# Patient Record
Sex: Female | Born: 1955 | Race: Black or African American | Hispanic: No | Marital: Married | Smoking: Former smoker
Health system: Southern US, Community
[De-identification: ages and names within clinical notes are randomized; demographics above are authoritative.]

## PROBLEM LIST (undated history)

## (undated) DIAGNOSIS — I10 Essential (primary) hypertension: Secondary | ICD-10-CM

## (undated) DIAGNOSIS — E119 Type 2 diabetes mellitus without complications: Secondary | ICD-10-CM

## (undated) DIAGNOSIS — B351 Tinea unguium: Secondary | ICD-10-CM

## (undated) DIAGNOSIS — E785 Hyperlipidemia, unspecified: Secondary | ICD-10-CM

## (undated) DIAGNOSIS — K649 Unspecified hemorrhoids: Secondary | ICD-10-CM

## (undated) HISTORY — PX: ABDOMINAL HYSTERECTOMY: SHX81

## (undated) HISTORY — DX: Unspecified hemorrhoids: K64.9

## (undated) HISTORY — DX: Tinea unguium: B35.1

## (undated) HISTORY — DX: Hyperlipidemia, unspecified: E78.5

---

## 2018-08-13 ENCOUNTER — Encounter: Payer: Self-pay | Admitting: Emergency Medicine

## 2018-08-13 ENCOUNTER — Other Ambulatory Visit: Payer: Self-pay

## 2018-08-13 ENCOUNTER — Ambulatory Visit
Admission: EM | Admit: 2018-08-13 | Discharge: 2018-08-13 | Disposition: A | Discharge status: Home / Self Care | Payer: Federal, State, Local not specified - PPO | Attending: Family Medicine | Admitting: Family Medicine

## 2018-08-13 ENCOUNTER — Ambulatory Visit (INDEPENDENT_AMBULATORY_CARE_PROVIDER_SITE_OTHER): Payer: Federal, State, Local not specified - PPO

## 2018-08-13 DIAGNOSIS — M545 Low back pain, unspecified: Secondary | ICD-10-CM

## 2018-08-13 DIAGNOSIS — K5900 Constipation, unspecified: Secondary | ICD-10-CM | POA: Diagnosis not present

## 2018-08-13 HISTORY — DX: Type 2 diabetes mellitus without complications: E11.9

## 2018-08-13 HISTORY — DX: Essential (primary) hypertension: I10

## 2018-08-13 NOTE — Discharge Instructions (Signed)
Increase water intake Miralax Tylenol as needed Heat to low back area

## 2018-08-13 NOTE — ED Provider Notes (Signed)
[facility]    CSN: [id number] Arrival date & time: 08/13/18  0827     History   Chief Complaint Chief Complaint  Patient presents with  . Constipation  . Back Pain    HPI Anne Foster is a 62 y.o. female.   62 yo female with a c/o low back pain and constipation for one week. Denies any specific injury, however states she does go to the gym to exercise several times per week. Yesterday took some Mag Citrate for her constipation which helped slightly. Denies any fevers, chills, melena, hematochezia, dysuria, hematuria.   The history is provided by the patient.  Constipation  Associated symptoms: back pain   Back Pain    Past Medical History:  Diagnosis Date  . Diabetes mellitus without complication (HCC)   . Hypertension     There are no active problems to display for this patient.   Past Surgical History:  Procedure Laterality Date  . ABDOMINAL HYSTERECTOMY      OB History   None      Home Medications    Prior to Admission medications   Medication Sig Start Date End Date Taking? Authorizing Provider  metFORMIN (GLUCOPHAGE-XR) 500 MG 24 hr tablet Take by mouth. 03/20/16  Yes [provider]  simvastatin (ZOCOR) 20 MG tablet Take by mouth. 03/24/16  Yes [provider]  fluticasone Aleda Grana) 50 MCG/ACT nasal spray  08/09/18   [provider]  losartan-hydrochlorothiazide Mauri Reading) 100-25 MG tablet  06/07/18   [provider]  Multiple Vitamin (MULTI-VITAMINS) TABS Take by mouth.    [provider]    Family History History reviewed. No pertinent family history.  Social History Social History   Tobacco Use  . Smoking status: Never Smoker  . Smokeless tobacco: Never Used  Substance Use Topics  . Alcohol use: Not Currently  . Drug use: Never     Allergies   Morphine; Nsaids; and Oxycodone-acetaminophen   Review of Systems Review of Systems  Gastrointestinal: Positive for constipation.    Musculoskeletal: Positive for back pain.     Physical Exam Triage Vital Signs ED Triage Vitals  Enc Vitals Group     BP 08/13/18 0847 120/78     Pulse Rate 08/13/18 0847 74     Resp 08/13/18 0847 18     Temp 08/13/18 0847 98.7 F (37.1 C)     Temp Source 08/13/18 0847 Oral     SpO2 08/13/18 0847 97 %     Weight 08/13/18 0842 (!) 302 lb (137 kg)     Height 08/13/18 0842 5\' 7"  (1.702 m)     Head Circumference --      Peak Flow --      Pain Score 08/13/18 0842 7     Pain Loc --      Pain Edu? --      Excl. in GC? --    No data found.  Updated Vital Signs BP 120/78 (BP Location: Left Arm)   Pulse 74   Temp 98.7 F (37.1 C) (Oral)   Resp 18   Ht 5\' 7"  (1.702 m)   Wt (!) 137 kg   SpO2 97%   BMI 47.30 kg/m   Visual Acuity Right Eye Distance:   Left Eye Distance:   Bilateral Distance:    Right Eye Near:   Left Eye Near:    Bilateral Near:     Physical Exam  Constitutional: She appears well-developed and well-nourished. No distress.  Abdominal:  Soft. Bowel sounds are normal. She exhibits no distension and no mass. There is no tenderness. There is no rebound and no guarding. No hernia.  Musculoskeletal:       Lumbar back: She exhibits tenderness (right sided lumbar sacral paraspinous muscles) and spasm. She exhibits normal range of motion, no bony tenderness, no swelling, no edema, no deformity, no laceration, no pain and normal pulse.  Skin: She is not diaphoretic.  Nursing note and vitals reviewed.    UC Treatments / Results  Labs (all labs ordered are listed, but only abnormal results are displayed) Labs Reviewed - No data to display  EKG None  Radiology Dg Abd 2 Views  Result Date: 08/13/2018 CLINICAL DATA:  Abdominal and back pain. EXAM: ABDOMEN - 2 VIEW COMPARISON:  None. FINDINGS: The visualized lung bases are grossly clear. The abdominal bowel gas pattern is unremarkable. Scattered stool noted throughout the colon and down into the rectum. Scattered  loops of small bowel with air but no distension or air-fluid levels to suggest obstruction. No free air is identified. The soft tissue shadows are maintained. No worrisome calcifications. The bony structures are unremarkable. IMPRESSION: Unremarkable abdominal radiographs. Electronically Signed   By: Rudie MeyerP.  Gallerani M.D.   On: 08/13/2018 09:52    Procedures Procedures (including critical care time)  Medications Ordered in UC Medications - No data to display  Initial Impression / Assessment and Plan / UC Course  I have reviewed the triage vital signs and the nursing notes.  Pertinent labs & imaging results that were available during my care of the patient were reviewed by me and considered in my medical decision making (see chart for details).      Final Clinical Impressions(s) / UC Diagnoses   Final diagnoses:  Constipation, unspecified constipation type  Acute right-sided low back pain without sciatica     Discharge Instructions     Increase water intake Miralax Tylenol as needed Heat to low back area    ED Prescriptions    None     1. x-ray results and diagnosis reviewed with patient 2. Recommend supportive treatment as above 3. Follow-up prn if symptoms worsen or don't improve    Controlled Substance Prescriptions [place] Controlled Substance Registry consulted? Not Applicable   Payton Mccallumonty, Tenessa Marsee, MD 08/13/18 1350

## 2018-08-13 NOTE — ED Triage Notes (Signed)
Patient c/o low back pain that started 1 week ago. She stated that she is contributing this to the constipation that she has had x 1 week. Yesterday she took Mag Citrate and her back pain has increased and she has not had a normal BM in 1-2 weeks. She reports last BM yesterday.

## 2019-10-31 IMAGING — CR DG ABDOMEN 2V
5 series · 5 of 5 positions shown · non-contrast
Comparison: None.

CLINICAL DATA: Abdominal and back pain.

EXAM:
ABDOMEN - 2 VIEW

[abdomen erect (1 of 3)]
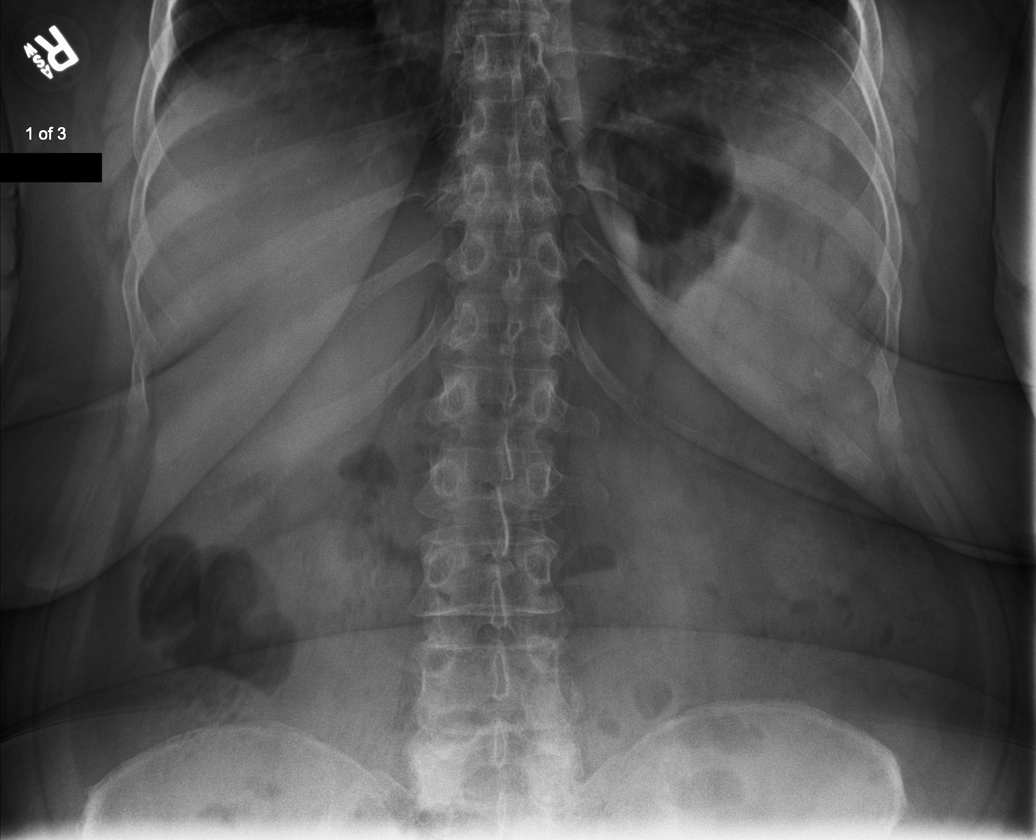

[abdomen supine (1 of 2)]
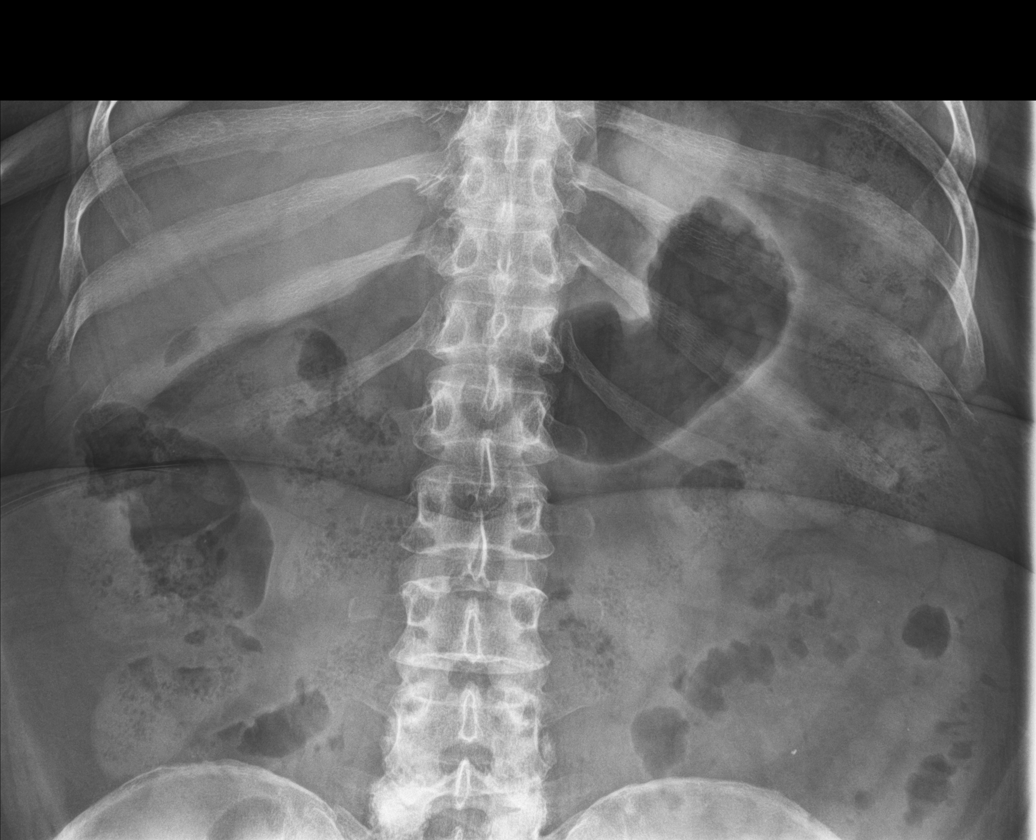

[abdomen supine (2 of 2)]
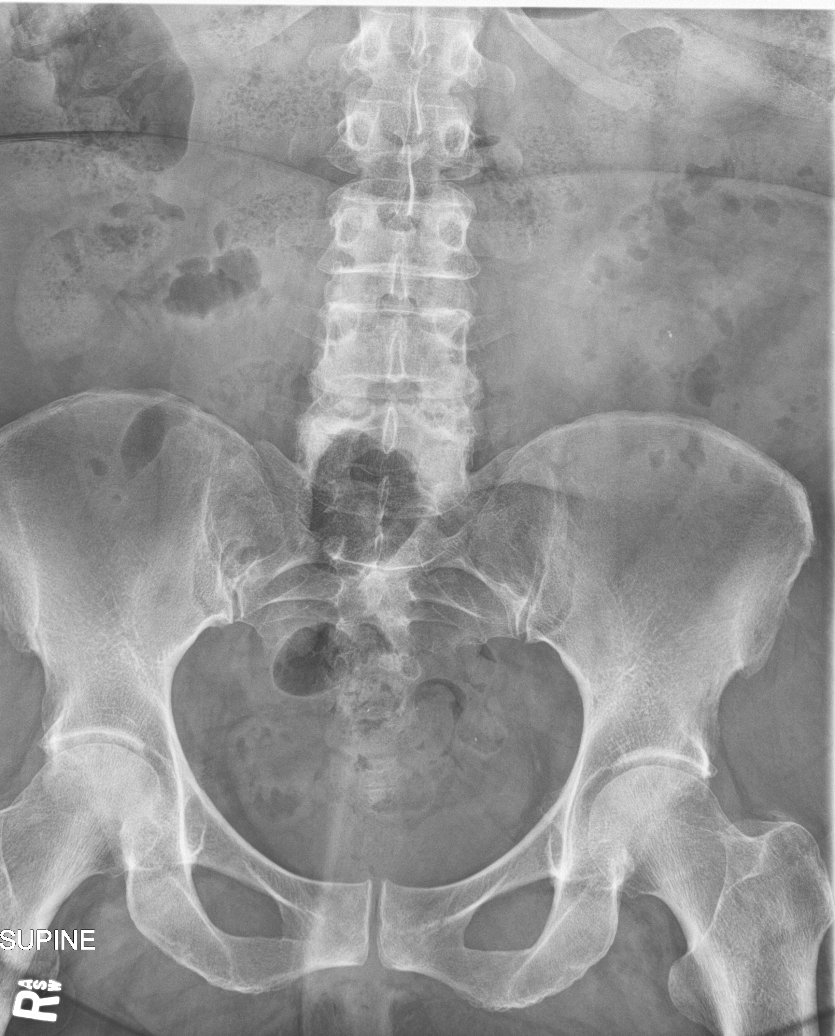

[abdomen erect (2 of 3)]
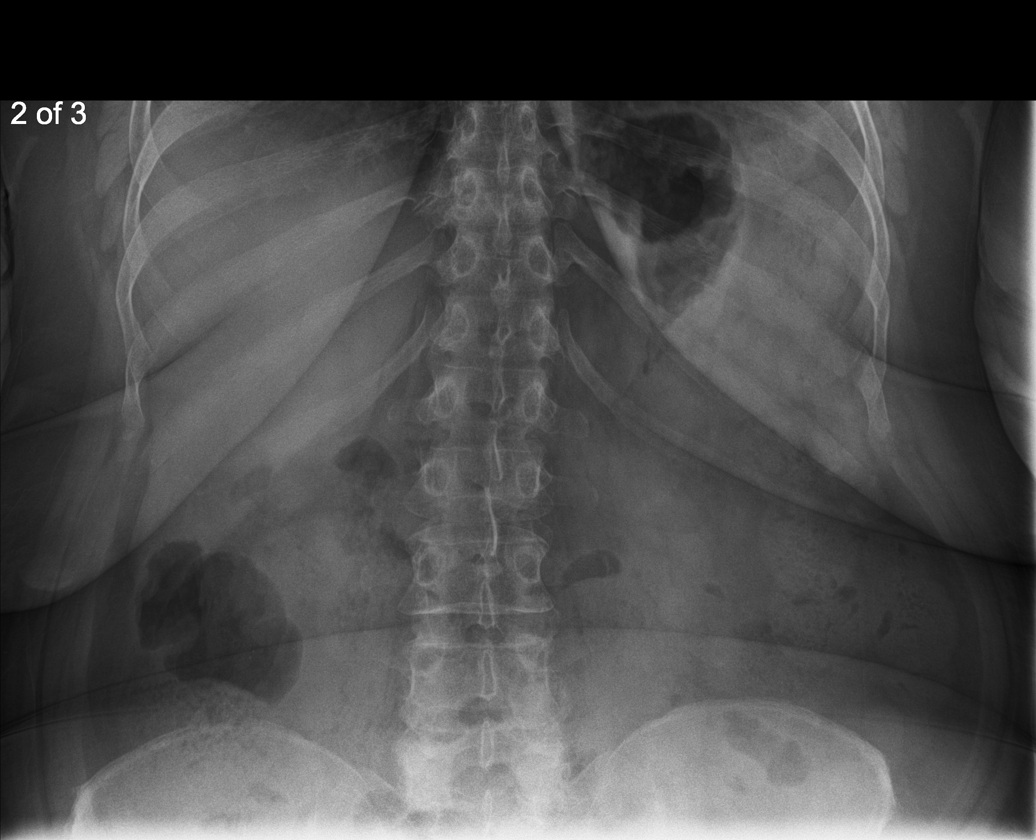

[abdomen erect (3 of 3)]
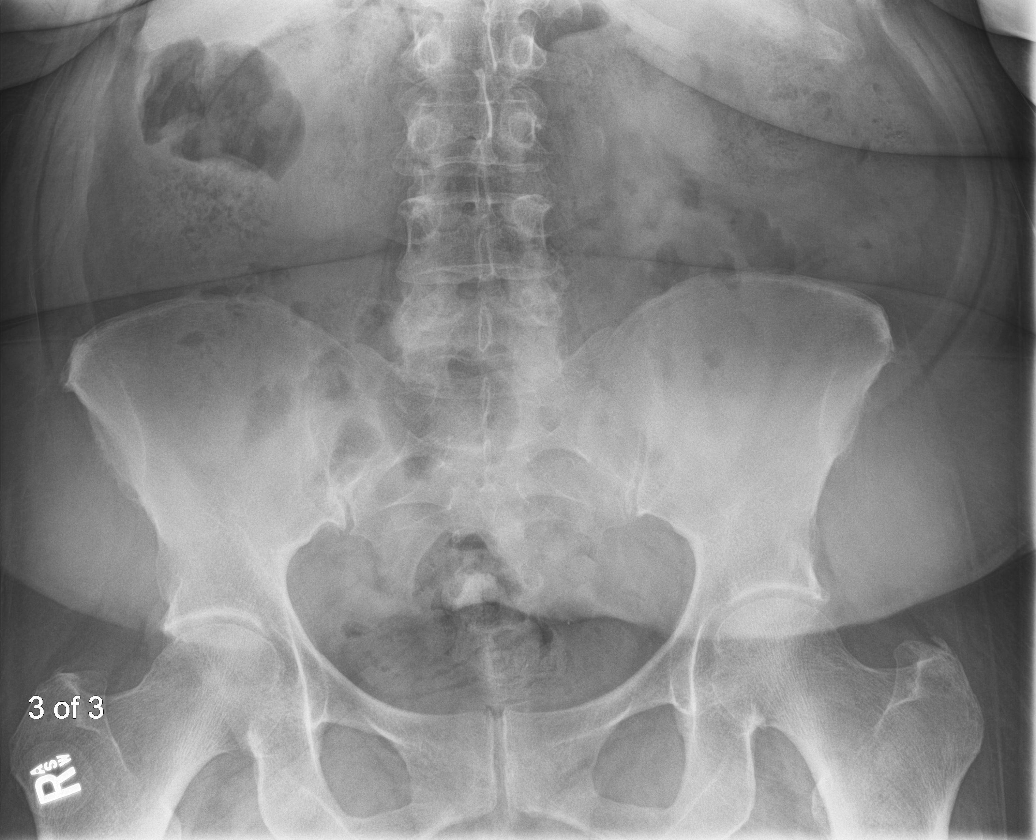

[5 of 5 positions shown; findings below may reference images not displayed]

FINDINGS: The visualized lung bases are grossly clear.

The abdominal bowel gas pattern is unremarkable. Scattered stool
noted throughout the colon and down into the rectum. Scattered loops
of small bowel with air but no distension or air-fluid levels to
suggest obstruction. No free air is identified. The soft tissue
shadows are maintained. No worrisome calcifications. The bony
structures are unremarkable.
IMPRESSION: Unremarkable abdominal radiographs.

## 2021-07-01 ENCOUNTER — Ambulatory Visit (INDEPENDENT_AMBULATORY_CARE_PROVIDER_SITE_OTHER): Payer: Medicare Other | Admitting: Internal Medicine

## 2021-07-01 VITALS — BP 136/91 | Resp 18 | Ht 67.0 in | Wt 270.1 lb

## 2021-07-01 DIAGNOSIS — G4733 Obstructive sleep apnea (adult) (pediatric): Secondary | ICD-10-CM | POA: Diagnosis not present

## 2021-07-01 DIAGNOSIS — Z7189 Other specified counseling: Secondary | ICD-10-CM | POA: Diagnosis not present

## 2021-07-01 DIAGNOSIS — I10 Essential (primary) hypertension: Secondary | ICD-10-CM | POA: Diagnosis not present

## 2021-07-01 DIAGNOSIS — Z9989 Dependence on other enabling machines and devices: Secondary | ICD-10-CM

## 2021-07-01 NOTE — Progress Notes (Signed)
[facility] [address] [place], [place] [address]  Pulmonary Sleep Medicine   Office Visit Note  Patient Name: Anne Foster DOB: June 25, 1956 MRN [id number]    Chief Complaint: Obstructive Sleep Apnea visit  Brief History:  Anne Foster is seen today for initial consult to establish care and currently on CPAP @ 12 cmH2O The patient has a 9y ear  history of sleep apnea. Patient is using PAP nightly and had issues with sinus problems.  The patient feels good enough to exercise in the mornings after sleeping with PAP.  The patient reports benefitting from PAP use.  Epworth Sleepiness Score is 3 out of 24. The patient 1 -2 times take naps. The patient complains of the following: dry mouth but declines increasing humidity, but prefers to drinks water as needed over night.  The compliance download shows  compliance with an average use time of 6:24 hours @ 87% hours. The AHI is 0.4  The patient does not complain  of limb movements disrupting sleep.  ROS  General: (-) fever, (-) chills, (-) night sweat Nose and Sinuses: (-) nasal stuffiness or itchiness, (-) postnasal drip, (-) nosebleeds, (-) sinus trouble. Mouth and Throat: (-) sore throat, (-) hoarseness. Neck: (-) swollen glands, (-) enlarged thyroid, (-) neck pain. Respiratory: - cough, - shortness of breath, - wheezing. Neurologic: - numbness, - tingling. Psychiatric: - anxiety, - depression   Current Medication: Outpatient Encounter Medications as of 07/01/2021  Medication Sig   fluticasone (FLONASE) 50 MCG/ACT nasal spray instill 2 sprays into each nostril once daily   atorvastatin (LIPITOR) 20 MG tablet Take 20 mg by mouth daily.   cyanocobalamin 1000 MCG tablet Take by mouth.   fluconazole (DIFLUCAN) 200 MG tablet Take by mouth.   fluticasone (FLONASE) 50 MCG/ACT nasal spray    hydrocortisone (PROCTOZONE-HC) 2.5 % rectal cream Proctozone-HC 2.5 % topical cream perineal applicator   losartan (COZAAR) 50 MG tablet Take  50 mg by mouth daily.   losartan-hydrochlorothiazide (HYZAAR) 100-25 MG tablet    metFORMIN (GLUCOPHAGE-XR) 500 MG 24 hr tablet Take by mouth.   Multiple Vitamin (MULTI-VITAMINS) TABS Take by mouth.   RYBELSUS 14 MG TABS Take 1 tablet by mouth every morning.   [DISCONTINUED] simvastatin (ZOCOR) 20 MG tablet Take by mouth.   No facility-administered encounter medications on file as of 07/01/2021.    Surgical History: Past Surgical History:  Procedure Laterality Date   ABDOMINAL HYSTERECTOMY      Medical History: Past Medical History:  Diagnosis Date   Diabetes mellitus without complication (HCC)    Hemorrhoid    Hyperlipidemia    Hypertension    Onychomycosis     Family History: Non contributory to the present illness  Social History: Social History   Socioeconomic History   Marital status: Married    Spouse name: Not on file   Number of children: Not on file   Years of education: Not on file   Highest education level: Not on file  Occupational History   Not on file  Tobacco Use   Smoking status: Former    Types: Cigarettes   Smokeless tobacco: Never  Substance and Sexual Activity   Alcohol use: Not Currently   Drug use: Never   Sexual activity: Not on file  Other Topics Concern   Not on file  Social History Narrative   Not on file   Social Determinants of Health   Financial Resource Strain: Not on file  Food Insecurity: Not on file  Transportation  Needs: Not on file  Physical Activity: Not on file  Stress: Not on file  Social Connections: Not on file  Intimate Partner Violence: Not on file    Vital Signs: Blood pressure (!) 136/91, resp. rate 18, height 5\' 7"  (1.702 m), weight 270 lb 1.6 oz (122.5 kg), SpO2 97 %.  Examination: General Appearance: The patient is well-developed, well-nourished, and in no distress. Neck Circumference: 36.5 Skin: Gross inspection of skin unremarkable. Head: normocephalic, no gross deformities. Eyes: no gross  deformities noted. ENT: ears appear grossly normal Neurologic: Alert and oriented. No involuntary movements.    EPWORTH SLEEPINESS SCALE:  Scale:  (0)= no chance of dozing; (1)= slight chance of dozing; (2)= moderate chance of dozing; (3)= high chance of dozing  Chance  Situtation    Sitting and reading: 0    Watching TV: 1    Sitting Inactive in public: 0    As a passenger in car: 1      Lying down to rest: 1    Sitting and talking: 0    Sitting quielty after lunch: 0    In a car, stopped in traffic: 0   TOTAL SCORE:   3 out of 24    SLEEP STUDIES:  PSG 03/22/12 -  AHI 32 REM 40.5, low SpO59min 83%,  recommended CPAP @ 12 cmH2O   CPAP COMPLIANCE DATA:  Date Range: 06/29/20-06/28/21  Average Daily Use: 6:24 hours  Median Use: 7:11cmH2O  Compliance for > 4 Hours: 87% days  AHI: 0.4 respiratory events per hour  Days Used: 324/365 days  Mask Leak: 26.3 lpm  95th Percentile Pressure: 12 cmH2O  LABS: No results found for this or any previous visit (from the past 2160 hour(s)).  Radiology: DG Abd 2 Views  Result Date: 08/13/2018 CLINICAL DATA:  Abdominal and back pain. EXAM: ABDOMEN - 2 VIEW COMPARISON:  None. FINDINGS: The visualized lung bases are grossly clear. The abdominal bowel gas pattern is unremarkable. Scattered stool noted throughout the colon and down into the rectum. Scattered loops of small bowel with air but no distension or air-fluid levels to suggest obstruction. No free air is identified. The soft tissue shadows are maintained. No worrisome calcifications. The bony structures are unremarkable. IMPRESSION: Unremarkable abdominal radiographs. Electronically Signed   By: 08/15/2018 M.D.   On: 08/13/2018 09:52    No results found.  No results found.    Assessment and Plan: Patient Active Problem List   Diagnosis Date Noted   OSA on CPAP 07/01/2021   CPAP use counseling 07/01/2021   Morbid obesity (HCC) 07/01/2021   Hypertension  07/01/2021   1. OSA on CPAP The patient does tolerate PAP and reports definite benefit from PAP use. The patient was reminded how to clean equipment and advised to replace supplies routinely. She has some leak and is reminded of the need to change out her seals etc. The patient was also counselled on weight loss. The compliance is very good. The AHI is 0.4.   OSA- continue with very good compliance. F/u one year.    2. CPAP use counseling CPAP Counseling: had a lengthy discussion with the patient regarding the importance of PAP therapy in management of the sleep apnea. Patient appears to understand the risk factor reduction and also understands the risks associated with untreated sleep apnea. Patient will try to make a good faith effort to remain compliant with therapy. Also instructed the patient on proper cleaning of the device including the water must be  changed daily if possible and use of distilled water is preferred. Patient understands that the machine should be regularly cleaned with appropriate recommended cleaning solutions that do not damage the PAP machine for example given white vinegar and water rinses. Other methods such as ozone treatment may not be as good as these simple methods to achieve cleaning.   3. Morbid obesity (HCC) Obesity Counseling: Had a lengthy discussion regarding patients BMI and weight issues. Patient was instructed on portion control as well as increased activity. Also discussed caloric restrictions with trying to maintain intake less than 2000 Kcal. Discussions were made in accordance with the 5As of weight management. Simple actions such as not eating late and if able to, taking a walk is suggested.   4. Hypertension, unspecified type Hypertension Counseling:   The following hypertensive lifestyle modification were recommended and discussed:  1. Limiting alcohol intake to less than 1 oz/day of ethanol:(24 oz of beer or 8 oz of wine or 2 oz of 100-proof  whiskey). 2. Take baby ASA 81 mg daily. 3. Importance of regular aerobic exercise and losing weight. 4. Reduce dietary saturated fat and cholesterol intake for overall cardiovascular health. 5. Maintaining adequate dietary potassium, calcium, and magnesium intake. 6. Regular monitoring of the blood pressure. 7. Reduce sodium intake to less than 100 mmol/day (less than 2.3 gm of sodium or less than 6 gm of sodium choride)      General Counseling: I have discussed the findings of the evaluation and examination with Scarlette Calico.  I have also discussed any further diagnostic evaluation thatmay be needed or ordered today. Emelynn verbalizes understanding of the findings of todays visit. We also reviewed her medications today and discussed drug interactions and side effects including but not limited excessive drowsiness and altered mental states. We also discussed that there is always a risk not just to her but also people around her. she has been encouraged to call the office with any questions or concerns that should arise related to todays visit.  No orders of the defined types were placed in this encounter.       I have personally obtained a history, examined the patient, evaluated laboratory and imaging results, formulated the assessment and plan and placed orders.   This patient was seen today by Emmaline Kluver, PA-C in collaboration with Dr. Freda Munro.    Yevonne Pax, MD [facility] Diplomate ABMS Pulmonary and Critical Care Medicine Sleep medicine

## 2021-07-01 NOTE — Patient Instructions (Signed)

## 2022-06-30 ENCOUNTER — Ambulatory Visit (INDEPENDENT_AMBULATORY_CARE_PROVIDER_SITE_OTHER): Payer: Medicare Other | Admitting: Internal Medicine

## 2022-06-30 VITALS — BP 119/83 | HR 83 | Resp 12 | Ht 66.0 in | Wt 275.6 lb

## 2022-06-30 DIAGNOSIS — Z7189 Other specified counseling: Secondary | ICD-10-CM | POA: Diagnosis not present

## 2022-06-30 DIAGNOSIS — I10 Essential (primary) hypertension: Secondary | ICD-10-CM

## 2022-06-30 DIAGNOSIS — G4733 Obstructive sleep apnea (adult) (pediatric): Secondary | ICD-10-CM

## 2022-06-30 DIAGNOSIS — Z9989 Dependence on other enabling machines and devices: Secondary | ICD-10-CM

## 2022-06-30 NOTE — Patient Instructions (Signed)

## 2022-06-30 NOTE — Progress Notes (Unsigned)
[facility] [address] [place], [place] [address]  Pulmonary Sleep Medicine   Office Visit Note  Patient Name: Anne Foster DOB: [birth year] MRN [id number]    Chief Complaint: Obstructive Sleep Apnea visit  Brief History:  [name] is seen today for an annual follow up visit for CPAP@ 12 cmH2O. The patient has a 10 year history of sleep apnea. Patient is using PAP nightly.  The patient feels rested after sleeping with PAP.  The patient reports benefiting from PAP use. Reported sleepiness is  improved and the Epworth Sleepiness Score is 4 out of 24. The patient continues to require PAP therapy in order to eliminate her sleep apnea. The patient does take naps 3 days a week for approximately an hour. The patient complains of the following: oral dryness. She keeps water by the bed. Admits to some mouth venting and uses a chin strap. Advised the Xylamelts and Biotene for help with this.  The compliance download shows 81% compliance with an average use time of 6 hours 29 minutes. The AHI is 0.4.  The patient does not complain of limb movements disrupting sleep.  ROS  General: (-) fever, (-) chills, (-) night sweat Nose and Sinuses: (-) nasal stuffiness or itchiness, (-) postnasal drip, (-) nosebleeds, (-) sinus trouble. Mouth and Throat: (-) sore throat, (-) hoarseness. Neck: (-) swollen glands, (-) enlarged thyroid, (-) neck pain. Respiratory: - cough, - shortness of breath, - wheezing. Neurologic: - numbness, - tingling. Psychiatric: - anxiety, - depression   Current Medication: Outpatient Encounter Medications as of 06/30/2022  Medication Sig   atorvastatin (LIPITOR) 20 MG tablet Take 20 mg by mouth daily.   cyanocobalamin 1000 MCG tablet Take by mouth.   fluconazole (DIFLUCAN) 200 MG tablet Take by mouth.   fluticasone (FLONASE) 50 MCG/ACT nasal spray    fluticasone (FLONASE) 50 MCG/ACT nasal spray instill 2 sprays into each nostril once daily   hydrocortisone  (PROCTOZONE-HC) 2.5 % rectal cream Proctozone-HC 2.5 % topical cream perineal applicator   losartan (COZAAR) 50 MG tablet Take 50 mg by mouth daily.   losartan-hydrochlorothiazide (HYZAAR) 100-25 MG tablet    metFORMIN (GLUCOPHAGE-XR) 500 MG 24 hr tablet Take by mouth.   Multiple Vitamin (MULTI-VITAMINS) TABS Take by mouth.   RYBELSUS 14 MG TABS Take 1 tablet by mouth every morning.   No facility-administered encounter medications on file as of 06/30/2022.    Surgical History: Past Surgical History:  Procedure Laterality Date   ABDOMINAL HYSTERECTOMY      Medical History: Past Medical History:  Diagnosis Date   Diabetes mellitus without complication (HCC)    Hemorrhoid    Hyperlipidemia    Hypertension    Onychomycosis     Family History: Non contributory to the present illness  Social History: Social History   Socioeconomic History   Marital status: Married    Spouse name: Not on file   Number of children: Not on file   Years of education: Not on file   Highest education level: Not on file  Occupational History   Not on file  Tobacco Use   Smoking status: Former    Types: Cigarettes   Smokeless tobacco: Never  Substance and Sexual Activity   Alcohol use: Not Currently   Drug use: Never   Sexual activity: Not on file  Other Topics Concern   Not on file  Social History Narrative   Not on file   Social Determinants of Health   Financial Resource Strain: Not on  file  Food Insecurity: Not on file  Transportation Needs: Not on file  Physical Activity: Not on file  Stress: Not on file  Social Connections: Not on file  Intimate Partner Violence: Not on file    Vital Signs: There were no vitals taken for this visit. There is no height or weight on file to calculate BMI.    Examination: General Appearance: The patient is well-developed, well-nourished, and in no distress. Neck Circumference: 36.5 cm Skin: Gross inspection of skin unremarkable. Head:  normocephalic, no gross deformities. Eyes: no gross deformities noted. ENT: ears appear grossly normal Neurologic: Alert and oriented. No involuntary movements.    EPWORTH SLEEPINESS SCALE:  Scale:  (0)= no chance of dozing; (1)= slight chance of dozing; (2)= moderate chance of dozing; (3)= high chance of dozing  Chance  Situtation    Sitting and reading: 1    Watching TV: 1    Sitting Inactive in public: 0    As a passenger in car: 1      Lying down to rest: 1    Sitting and talking: 0    Sitting quielty after lunch: 0    In a car, stopped in traffic: 0   TOTAL SCORE:   4 out of 24    SLEEP STUDIES:  PSG (03/2012) AHI 32/hr, REM AHI 41/hr, min SpO2 83% Titration (03/2020) CPAP@ 12 cmH2O   CPAP COMPLIANCE DATA:  Date Range: 06/27/2021-06/26/2022  Average Daily Use: 6 hours 29 minutes  Median Use: 6 hours 23 minutes  Compliance for > 4 Hours: 81%  AHI: 0.4 respiratory events per hour  Days Used: 312/365 days  Mask Leak: 28.8  95th Percentile Pressure: 12         LABS: No results found for this or any previous visit (from the past 2160 hour(s)).  Radiology: DG Abd 2 Views  Result Date: 08/13/2018 CLINICAL DATA:  Abdominal and back pain. EXAM: ABDOMEN - 2 VIEW COMPARISON:  None. FINDINGS: The visualized lung bases are grossly clear. The abdominal bowel gas pattern is unremarkable. Scattered stool noted throughout the colon and down into the rectum. Scattered loops of small bowel with air but no distension or air-fluid levels to suggest obstruction. No free air is identified. The soft tissue shadows are maintained. No worrisome calcifications. The bony structures are unremarkable. IMPRESSION: Unremarkable abdominal radiographs. Electronically Signed   By: Rudie Meyer M.D.   On: 08/13/2018 09:52    No results found.  No results found.    Assessment and Plan: Patient Active Problem List   Diagnosis Date Noted   OSA on CPAP 07/01/2021    CPAP use counseling 07/01/2021   Morbid obesity (HCC) 07/01/2021   Hypertension 07/01/2021    1. OSA on CPAP The patient does  tolerate PAP and reports  benefit from PAP use. The patient was reminded how to clean equipment and advised to replace supplies routinely. The patient was also counselled on weight loss. The compliance is good. The AHI is 0.4.   OSA on cpap- CPAP continues to be medically necessary to treat this patient's OSA.  Continue with good compliance. F/u one year.    2. CPAP use counseling CPAP Counseling: had a lengthy discussion with the patient regarding the importance of PAP therapy in management of the sleep apnea. Patient appears to understand the risk factor reduction and also understands the risks associated with untreated sleep apnea. Patient will try to make a good faith effort to remain compliant with therapy. Also  instructed the patient on proper cleaning of the device including the water must be changed daily if possible and use of distilled water is preferred. Patient understands that the machine should be regularly cleaned with appropriate recommended cleaning solutions that do not damage the PAP machine for example given white vinegar and water rinses. Other methods such as ozone treatment may not be as good as these simple methods to achieve cleaning.   3. Morbid obesity (HCC) Obesity Counseling: Had a lengthy discussion regarding patients BMI and weight issues. Patient was instructed on portion control as well as increased activity. Also discussed caloric restrictions with trying to maintain intake less than 2000 Kcal. Discussions were made in accordance with the 5As of weight management. Simple actions such as not eating late and if able to, taking a walk is suggested.   4. Hypertension, unspecified type Hypertension Counseling:   The following hypertensive lifestyle modification were recommended and discussed:  1. Limiting alcohol intake to less than 1 oz/day  of ethanol:(24 oz of beer or 8 oz of wine or 2 oz of 100-proof whiskey). 2. Take baby ASA 81 mg daily. 3. Importance of regular aerobic exercise and losing weight. 4. Reduce dietary saturated fat and cholesterol intake for overall cardiovascular health. 5. Maintaining adequate dietary potassium, calcium, and magnesium intake. 6. Regular monitoring of the blood pressure. 7. Reduce sodium intake to less than 100 mmol/day (less than 2.3 gm of sodium or less than 6 gm of sodium choride)     General Counseling: I have discussed the findings of the evaluation and examination with Scarlette Calico.  I have also discussed any further diagnostic evaluation thatmay be needed or ordered today. Florabelle verbalizes understanding of the findings of todays visit. We also reviewed her medications today and discussed drug interactions and side effects including but not limited excessive drowsiness and altered mental states. We also discussed that there is always a risk not just to her but also people around her. she has been encouraged to call the office with any questions or concerns that should arise related to todays visit.  No orders of the defined types were placed in this encounter.       I have personally obtained a history, examined the patient, evaluated laboratory and imaging results, formulated the assessment and plan and placed orders. This patient was seen today by Emmaline Kluver, PA-C in collaboration with Dr. Freda Munro.   Yevonne Pax, MD [facility] Diplomate ABMS Pulmonary Critical Care Medicine and Sleep Medicine

## 2023-06-29 ENCOUNTER — Ambulatory Visit (INDEPENDENT_AMBULATORY_CARE_PROVIDER_SITE_OTHER): Payer: Medicare Other | Admitting: Internal Medicine

## 2023-06-29 VITALS — BP 122/92 | HR 70 | Resp 14 | Ht 67.0 in | Wt 276.0 lb

## 2023-06-29 DIAGNOSIS — G4733 Obstructive sleep apnea (adult) (pediatric): Secondary | ICD-10-CM

## 2023-06-29 DIAGNOSIS — Z7189 Other specified counseling: Secondary | ICD-10-CM

## 2023-06-29 NOTE — Progress Notes (Unsigned)
[facility] [address] [place], [place] [address]  Pulmonary Sleep Medicine   Office Visit Note  Patient Name: Anne Foster DOB: 1956-05-05 MRN [id number]    Chief Complaint: Obstructive Sleep Apnea visit  Brief History:  Anne Foster is seen today for an annual follow up on CPAP at 12 cmh20.  The patient has a 11 year history of sleep apnea. Patient is using PAP nightly.  The patient feels rested after sleeping with PAP.  The patient reports benefiting from PAP use. Reported sleepiness is  improved and the Epworth Sleepiness Score is 4 out of 24. The patient occasionally take naps. The patient complains of the following: No complaints.  The compliance download shows 92% compliance with an average use time of 6:06 hours. The AHI is 0.3.  The patient does not complain of limb movements disrupting sleep.  ROS  General: (-) fever, (-) chills, (-) night sweat Nose and Sinuses: (-) nasal stuffiness or itchiness, (-) postnasal drip, (-) nosebleeds, (-) sinus trouble. Mouth and Throat: (-) sore throat, (-) hoarseness. Neck: (-) swollen glands, (-) enlarged thyroid, (-) neck pain. Respiratory: - cough, - shortness of breath, - wheezing. Neurologic: - numbness, - tingling. Psychiatric: - anxiety, - depression   Current Medication: Outpatient Encounter Medications as of 06/29/2023  Medication Sig   tirzepatide (MOUNJARO) 10 MG/0.5ML Pen    atorvastatin (LIPITOR) 20 MG tablet Take 20 mg by mouth daily.   cetirizine (ZYRTEC) 10 MG tablet Take by mouth.   cyanocobalamin 1000 MCG tablet Take by mouth.   fluticasone (FLONASE) 50 MCG/ACT nasal spray    fluticasone (FLONASE) 50 MCG/ACT nasal spray instill 2 sprays into each nostril once daily   hydrochlorothiazide (HYDRODIURIL) 12.5 MG tablet    hydrocortisone (PROCTOZONE-HC) 2.5 % rectal cream Proctozone-HC 2.5 % topical cream perineal applicator   losartan (COZAAR) 100 MG tablet    Multiple Vitamin (MULTI-VITAMINS) TABS Take by  mouth.   [DISCONTINUED] fluconazole (DIFLUCAN) 200 MG tablet Take by mouth.   [DISCONTINUED] losartan (COZAAR) 50 MG tablet Take 50 mg by mouth daily.   [DISCONTINUED] losartan-hydrochlorothiazide (HYZAAR) 100-25 MG tablet    [DISCONTINUED] metFORMIN (GLUCOPHAGE-XR) 500 MG 24 hr tablet Take by mouth.   [DISCONTINUED] RYBELSUS 14 MG TABS Take 1 tablet by mouth every morning.   No facility-administered encounter medications on file as of 06/29/2023.    Surgical History: Past Surgical History:  Procedure Laterality Date   ABDOMINAL HYSTERECTOMY      Medical History: Past Medical History:  Diagnosis Date   Diabetes mellitus without complication (HCC)    Hemorrhoid    Hyperlipidemia    Hypertension    Onychomycosis     Family History: Non contributory to the present illness  Social History: Social History   Socioeconomic History   Marital status: Married    Spouse name: Not on file   Number of children: Not on file   Years of education: Not on file   Highest education level: Not on file  Occupational History   Not on file  Tobacco Use   Smoking status: Former    Types: Cigarettes   Smokeless tobacco: Never  Substance and Sexual Activity   Alcohol use: Not Currently   Drug use: Never   Sexual activity: Not on file  Other Topics Concern   Not on file  Social History Narrative   Not on file   Social Determinants of Health   Financial Resource Strain: Not on file  Food Insecurity: Not on file  Transportation  Needs: Not on file  Physical Activity: Not on file  Stress: Not on file  Social Connections: Not on file  Intimate Partner Violence: Not on file    Vital Signs: Blood pressure (!) 122/92, pulse 70, resp. rate 14, height 5\' 7"  (1.702 m), weight 276 lb (125.2 kg), SpO2 98%. Body mass index is 43.23 kg/m.    Examination: General Appearance: The patient is well-developed, well-nourished, and in no distress. Neck Circumference: 37 cm Skin: Gross  inspection of skin unremarkable. Head: normocephalic, no gross deformities. Eyes: no gross deformities noted. ENT: ears appear grossly normal Neurologic: Alert and oriented. No involuntary movements.  STOP BANG RISK ASSESSMENT S (snore) Have you been told that you snore?     NO   T (tired) Are you often tired, fatigued, or sleepy during the day?   NO  O (obstruction) Do you stop breathing, choke, or gasp during sleep? NO   P (pressure) Do you have or are you being treated for high blood pressure? YES   B (BMI) Is your body index greater than 35 kg/m? YES   A (age) Are you 69 years old or older? YES   N (neck) Do you have a neck circumference greater than 16 inches?   NO   G (gender) Are you a female? NO   TOTAL STOP/BANG "YES" ANSWERS 3       A STOP-Bang score of 2 or less is considered low risk, and a score of 5 or more is high risk for having either moderate or severe OSA. For people who score 3 or 4, doctors may need to perform further assessment to determine how likely they are to have OSA.         EPWORTH SLEEPINESS SCALE:  Scale:  (0)= no chance of dozing; (1)= slight chance of dozing; (2)= moderate chance of dozing; (3)= high chance of dozing  Chance  Situtation    Sitting and reading: 1    Watching TV: 1    Sitting Inactive in public: 0    As a passenger in car: 2      Lying down to rest: 0    Sitting and talking: 0    Sitting quielty after lunch: 0    In a car, stopped in traffic: 0   TOTAL SCORE:   4 out of 24    SLEEP STUDIES:  PSG (04/13) AHI 32, REM AHI 41, min SP02 83%   CPAP COMPLIANCE DATA:  Date Range: 06/25/22 - 06/24/23  Average Daily Use: 6:06 hours  Median Use: 6:19 hours  Compliance for > 4 Hours: 335 days  AHI: 0.3 respiratory events per hour  Days Used: 346/365  Mask Leak: 24.5  95th Percentile Pressure: 12 cmh20         LABS: No results found for this or any previous visit (from the past 2160  hour(s)).  Radiology: DG Abd 2 Views  Result Date: 08/13/2018 CLINICAL DATA:  Abdominal and back pain. EXAM: ABDOMEN - 2 VIEW COMPARISON:  None. FINDINGS: The visualized lung bases are grossly clear. The abdominal bowel gas pattern is unremarkable. Scattered stool noted throughout the colon and down into the rectum. Scattered loops of small bowel with air but no distension or air-fluid levels to suggest obstruction. No free air is identified. The soft tissue shadows are maintained. No worrisome calcifications. The bony structures are unremarkable. IMPRESSION: Unremarkable abdominal radiographs. Electronically Signed   By: Rudie Meyer M.D.   On: 08/13/2018 09:52  No results found.  No results found.    Assessment and Plan: Patient Active Problem List   Diagnosis Date Noted   OSA on CPAP 07/01/2021   CPAP use counseling 07/01/2021   Morbid obesity (HCC) 07/01/2021   Hypertension 07/01/2021   1. OSA on CPAP The patient does tolerate PAP and reports  benefit from PAP use. The patient was reminded how to clean equipment and advised to replace supplies routinely. The patient was also counselled on weight loss. The compliance is very good. The AHI is 0.3.   OSA on cpap- controlled. Continue with excellent compliance with pap. CPAP continues to be medically necessary to treat this patient's OSA. F/u one year.    2. CPAP use counseling CPAP Counseling: had a lengthy discussion with the patient regarding the importance of PAP therapy in management of the sleep apnea. Patient appears to understand the risk factor reduction and also understands the risks associated with untreated sleep apnea. Patient will try to make a good faith effort to remain compliant with therapy. Also instructed the patient on proper cleaning of the device including the water must be changed daily if possible and use of distilled water is preferred. Patient understands that the machine should be regularly cleaned with  appropriate recommended cleaning solutions that do not damage the PAP machine for example given white vinegar and water rinses. Other methods such as ozone treatment may not be as good as these simple methods to achieve cleaning.   3. Morbid obesity (HCC) Obesity Counseling: Had a lengthy discussion regarding patients BMI and weight issues. Patient was instructed on portion control as well as increased activity. Also discussed caloric restrictions with trying to maintain intake less than 2000 Kcal. Discussions were made in accordance with the 5As of weight management. Simple actions such as not eating late and if able to, taking a walk is suggested.       General Counseling: I have discussed the findings of the evaluation and examination with Scarlette Calico.  I have also discussed any further diagnostic evaluation thatmay be needed or ordered today. [place] verbalizes understanding of the findings of todays visit. We also reviewed her medications today and discussed drug interactions and side effects including but not limited excessive drowsiness and altered mental states. We also discussed that there is always a risk not just to her but also people around her. she has been encouraged to call the office with any questions or concerns that should arise related to todays visit.  No orders of the defined types were placed in this encounter.       I have personally obtained a history, examined the patient, evaluated laboratory and imaging results, formulated the assessment and plan and placed orders. This patient was seen today by Emmaline Kluver, PA-C in collaboration with Dr. Freda Munro.   Yevonne Pax, MD [facility] Diplomate ABMS Pulmonary Critical Care Medicine and Sleep Medicine

## 2023-06-29 NOTE — Patient Instructions (Signed)

## 2024-06-24 NOTE — Progress Notes (Signed)
 [facility] [address] [place], [place] [address]  Pulmonary Sleep Medicine   Office Visit Note  Patient Name: Anne Foster DOB: [birth year] MRN [id number]    Chief Complaint: Obstructive Sleep Apnea visit  Brief History:  Anne Foster is seen today for an annual follow up visit for CPAP@ 12 cmH2O. The patient has a 12 year history of sleep apnea. Patient is using PAP nightly.  The patient feels rested after sleeping with PAP.  The patient reports benefiting from PAP use. Reported sleepiness is  improved and the Epworth Sleepiness Score is 3 out of 24. The patient does not take naps. The patient complains of the following: pt is in need of a new water chamber.  The compliance download shows 99% compliance with an average use time of 7 hours 16 minutes. The AHI is 0.4.  The patient occasionally complains  of limb movements disrupting sleep. The patient continues to require PAP therapy in order to eliminate sleep apnea.   ROS  General: (-) fever, (-) chills, (-) night sweat Nose and Sinuses: (-) nasal stuffiness or itchiness, (-) postnasal drip, (-) nosebleeds, (-) sinus trouble. Mouth and Throat: (-) sore throat, (-) hoarseness. Neck: (-) swollen glands, (-) enlarged thyroid, (-) neck pain. Respiratory: - cough, - shortness of breath, - wheezing. Neurologic: - numbness, - tingling. Psychiatric: - anxiety, - depression   Current Medication: Outpatient Encounter Medications as of 06/27/2024  Medication Sig   MOUNJARO 12.5 MG/0.5ML Pen Inject 12.5 mg into the skin once a week.   atorvastatin (LIPITOR) 20 MG tablet Take 20 mg by mouth daily.   cetirizine (ZYRTEC) 10 MG tablet Take by mouth.   cyanocobalamin 1000 MCG tablet Take by mouth.   fluticasone (FLONASE) 50 MCG/ACT nasal spray instill 2 sprays into each nostril once daily   hydrochlorothiazide (HYDRODIURIL) 12.5 MG tablet    hydrocortisone (PROCTOZONE-HC) 2.5 % rectal cream Proctozone-HC 2.5 % topical cream perineal  applicator   losartan (COZAAR) 100 MG tablet    Multiple Vitamin (MULTI-VITAMINS) TABS Take by mouth.   [DISCONTINUED] fluticasone (FLONASE) 50 MCG/ACT nasal spray    [DISCONTINUED] tirzepatide (MOUNJARO) 10 MG/0.5ML Pen    No facility-administered encounter medications on file as of 06/27/2024.    Surgical History: Past Surgical History:  Procedure Laterality Date   ABDOMINAL HYSTERECTOMY      Medical History: Past Medical History:  Diagnosis Date   Diabetes mellitus without complication (HCC)    Hemorrhoid    Hyperlipidemia    Hypertension    Onychomycosis     Family History: Non contributory to the present illness  Social History: Social History   Socioeconomic History   Marital status: Married    Spouse name: Not on file   Number of children: Not on file   Years of education: Not on file   Highest education level: Not on file  Occupational History   Not on file  Tobacco Use   Smoking status: Former    Types: Cigarettes   Smokeless tobacco: Never  Substance and Sexual Activity   Alcohol use: Not Currently   Drug use: Never   Sexual activity: Not on file  Other Topics Concern   Not on file  Social History Narrative   Not on file   Social Drivers of Health   Financial Resource Strain: Not on file  Food Insecurity: Not on file  Transportation Needs: Not on file  Physical Activity: Not on file  Stress: Not on file  Social Connections: Not on file  Intimate Partner Violence: Not on file    Vital Signs: Blood pressure 111/78, pulse 76, resp. rate 16, height 5' 6 (1.676 m), weight 273 lb (123.8 kg), SpO2 97%. Body mass index is 44.06 kg/m.    Examination: General Appearance: The patient is well-developed, well-nourished, and in no distress. Neck Circumference: 37 cm Skin: Gross inspection of skin unremarkable. Head: normocephalic, no gross deformities. Eyes: no gross deformities noted. ENT: ears appear grossly normal Neurologic: Alert and  oriented. No involuntary movements.  STOP BANG RISK ASSESSMENT S (snore) Have you been told that you snore?     NO   T (tired) Are you often tired, fatigued, or sleepy during the day?   NO  O (obstruction) Do you stop breathing, choke, or gasp during sleep? NO   P (pressure) Do you have or are you being treated for high blood pressure? YES   B (BMI) Is your body index greater than 35 kg/m? YES   A (age) Are you 61 years old or older? YES   N (neck) Do you have a neck circumference greater than 16 inches?   NO   G (gender) Are you a female? NO   TOTAL STOP/BANG "YES" ANSWERS 3       A STOP-Bang score of 2 or less is considered low risk, and a score of 5 or more is high risk for having either moderate or severe OSA. For people who score 3 or 4, doctors may need to perform further assessment to determine how likely they are to have OSA.         EPWORTH SLEEPINESS SCALE:  Scale:  (0)= no chance of dozing; (1)= slight chance of dozing; (2)= moderate chance of dozing; (3)= high chance of dozing  Chance  Situtation    Sitting and reading: 1    Watching TV: 1    Sitting Inactive in public: 0    As a passenger in car: 1      Lying down to rest: 0    Sitting and talking: 0    Sitting quielty after lunch: 0    In a car, stopped in traffic: 0   TOTAL SCORE:   3 out of 24    SLEEP STUDIES:  PSG (03/2012) AHI 32, REM AHI 41, min SP02 83%  Titration (03/2012) CPAP@ 12 cmH2O   CPAP COMPLIANCE DATA:  Date Range: 06/25/2023-06/23/2024  Average Daily Use: 7 hours 16 minutes  Median Use: 7 hours 15 minutes  Compliance for > 4 Hours: 99%  AHI: 0.4 respiratory events per hour  Days Used: 7 hours 16 minutes  Mask Leak: 7 hours 15 minutes  95th Percentile Pressure: 12         LABS: No results found for this or any previous visit (from the past 2160 hours).  Radiology: DG Abd 2 Views Result Date: 08/13/2018 CLINICAL DATA:  Abdominal and back pain. EXAM:  ABDOMEN - 2 VIEW COMPARISON:  None. FINDINGS: The visualized lung bases are grossly clear. The abdominal bowel gas pattern is unremarkable. Scattered stool noted throughout the colon and down into the rectum. Scattered loops of small bowel with air but no distension or air-fluid levels to suggest obstruction. No free air is identified. The soft tissue shadows are maintained. No worrisome calcifications. The bony structures are unremarkable. IMPRESSION: Unremarkable abdominal radiographs. Electronically Signed   By: MYRTIS Stammer M.D.   On: 08/13/2018 09:52    No results found.  No results found.    Assessment and  Plan: Patient Active Problem List   Diagnosis Date Noted   OSA on CPAP 07/01/2021   CPAP use counseling 07/01/2021   Morbid obesity (HCC) 07/01/2021   Hypertension 07/01/2021   1. OSA on CPAP (Primary) The patient does tolerate PAP and reports  benefit from PAP use. Her apnea is well controlled.  The patient was reminded how to clean equipment and advised to replace supplies routinely. The patient was also counselled on weight loss. The compliance is excellent. The AHI is 0.4.   OSA on cpap- controlled. Continue with excellent compliance with pap. CPAP continues to be medically necessary to treat this patient's OSA. F/u one year.    2. CPAP use counseling CPAP Counseling: had a lengthy discussion with the patient regarding the importance of PAP therapy in management of the sleep apnea. Patient appears to understand the risk factor reduction and also understands the risks associated with untreated sleep apnea. Patient will try to make a good faith effort to remain compliant with therapy. Also instructed the patient on proper cleaning of the device including the water must be changed daily if possible and use of distilled water is preferred. Patient understands that the machine should be regularly cleaned with appropriate recommended cleaning solutions that do not damage the PAP  machine for example given white vinegar and water rinses. Other methods such as ozone treatment may not be as good as these simple methods to achieve cleaning.   3. Morbid obesity (HCC) Obesity Counseling: Had a lengthy discussion regarding patients BMI and weight issues. Patient was instructed on portion control as well as increased activity. Also discussed caloric restrictions with trying to maintain intake less than 2000 Kcal. Discussions were made in accordance with the 5As of weight management. Simple actions such as not eating late and if able to, taking a walk is suggested.   4. Hypertension, unspecified type Controlled with losartan and hydrochlorothiazide. Continue.     General Counseling: I have discussed the findings of the evaluation and examination with Cathlean.  I have also discussed any further diagnostic evaluation thatmay be needed or ordered today. Avleen verbalizes understanding of the findings of todays visit. We also reviewed her medications today and discussed drug interactions and side effects including but not limited excessive drowsiness and altered mental states. We also discussed that there is always a risk not just to her but also people around her. she has been encouraged to call the office with any questions or concerns that should arise related to todays visit.  No orders of the defined types were placed in this encounter.       I have personally obtained a history, examined the patient, evaluated laboratory and imaging results, formulated the assessment and plan and placed orders. This patient was seen today by Lauraine Lay, PA-C in collaboration with Dr. Elfreda Bathe.   Elfreda DELENA Bathe, MD [facility] Diplomate ABMS Pulmonary Critical Care Medicine and Sleep Medicine

## 2024-06-27 ENCOUNTER — Ambulatory Visit (INDEPENDENT_AMBULATORY_CARE_PROVIDER_SITE_OTHER): Admitting: Internal Medicine

## 2024-06-27 VITALS — BP 111/78 | HR 76 | Resp 16 | Ht 66.0 in | Wt 273.0 lb

## 2024-06-27 DIAGNOSIS — Z7189 Other specified counseling: Secondary | ICD-10-CM | POA: Diagnosis not present

## 2024-06-27 DIAGNOSIS — I10 Essential (primary) hypertension: Secondary | ICD-10-CM

## 2024-06-27 DIAGNOSIS — G4733 Obstructive sleep apnea (adult) (pediatric): Secondary | ICD-10-CM

## 2024-06-27 NOTE — Patient Instructions (Signed)
